# Patient Record
Sex: Female | Born: 1950 | Race: White | Hispanic: No | Marital: Married
Health system: Southern US, Community
[De-identification: ages and names within clinical notes are randomized; demographics above are authoritative.]

## PROBLEM LIST (undated history)

## (undated) DIAGNOSIS — J45909 Unspecified asthma, uncomplicated: Secondary | ICD-10-CM

## (undated) DIAGNOSIS — K760 Fatty (change of) liver, not elsewhere classified: Secondary | ICD-10-CM

## (undated) DIAGNOSIS — H3500 Unspecified background retinopathy: Secondary | ICD-10-CM

## (undated) DIAGNOSIS — K219 Gastro-esophageal reflux disease without esophagitis: Secondary | ICD-10-CM

## (undated) DIAGNOSIS — M1990 Unspecified osteoarthritis, unspecified site: Secondary | ICD-10-CM

## (undated) DIAGNOSIS — K5790 Diverticulosis of intestine, part unspecified, without perforation or abscess without bleeding: Secondary | ICD-10-CM

## (undated) DIAGNOSIS — H3530 Unspecified macular degeneration: Secondary | ICD-10-CM

## (undated) DIAGNOSIS — K449 Diaphragmatic hernia without obstruction or gangrene: Secondary | ICD-10-CM

## (undated) DIAGNOSIS — G629 Polyneuropathy, unspecified: Secondary | ICD-10-CM

## (undated) HISTORY — DX: Diaphragmatic hernia without obstruction or gangrene: K44.9

## (undated) HISTORY — DX: Unspecified asthma, uncomplicated: J45.909

## (undated) HISTORY — PX: TUBAL LIGATION: SHX77

## (undated) HISTORY — DX: Unspecified macular degeneration: H35.30

## (undated) HISTORY — DX: Diverticulosis of intestine, part unspecified, without perforation or abscess without bleeding: K57.90

## (undated) HISTORY — PX: CHOLECYSTECTOMY: SHX55

## (undated) HISTORY — DX: Unspecified osteoarthritis, unspecified site: M19.90

## (undated) HISTORY — DX: Unspecified background retinopathy: H35.00

## (undated) HISTORY — DX: Polyneuropathy, unspecified: G62.9

## (undated) HISTORY — DX: Gastro-esophageal reflux disease without esophagitis: K21.9

## (undated) HISTORY — DX: Fatty (change of) liver, not elsewhere classified: K76.0

---

## 1979-02-10 HISTORY — PX: CARDIAC CATHETERIZATION: SHX172

## 1980-06-11 HISTORY — PX: TOTAL ABDOMINAL HYSTERECTOMY W/ BILATERAL SALPINGOOPHORECTOMY: SHX83

## 2005-05-07 ENCOUNTER — Encounter: Payer: Self-pay | Admitting: Cardiology

## 2005-06-11 HISTORY — PX: REFRACTIVE SURGERY: SHX103

## 2005-11-02 ENCOUNTER — Encounter: Payer: Self-pay | Admitting: Cardiology

## 2006-02-05 ENCOUNTER — Encounter: Payer: Self-pay | Admitting: Cardiology

## 2007-04-12 ENCOUNTER — Encounter: Payer: Self-pay | Admitting: Cardiology

## 2008-01-08 ENCOUNTER — Encounter: Payer: Self-pay | Admitting: Cardiology

## 2008-07-24 ENCOUNTER — Encounter: Payer: Self-pay | Admitting: Cardiology

## 2008-08-19 ENCOUNTER — Ambulatory Visit: Payer: Self-pay | Admitting: Cardiology

## 2009-02-02 DIAGNOSIS — R079 Chest pain, unspecified: Secondary | ICD-10-CM | POA: Insufficient documentation

## 2010-10-24 NOTE — Assessment & Plan Note (Signed)
[facility]                          [facility] OFFICE NOTE   Kaylee Krause, Kaylee Krause                      MRN:          [id number]  DATE:08/19/2008                            DOB:          10/22/1950    REFERRING PHYSICIAN:  Nickie Retort, M.D.   REQUESTING PHYSICIAN:  Dr. Nickie Retort at [facility] in  [place].   REASON FOR CONSULTATION:  Chest discomfort.   HISTORY OF PRESENT ILLNESS:  Ms. 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  She has no personal history of obstructive coronary  artery disease.  Ms. Draughon states that back in the 1980s she  underwent a diagnostic cardiac catheterization at [facility] in the setting of left-sided chest pain, and reports that this  showed clean coronaries with subsequent diagnosis of gallbladder  disease resulting in a cholecystectomy.  She states that approximately 6-  8 months ago she began to experience a twisting discomfort in her  upper back radiating into her arms and ultimately culminating in hand  weakness.  This was sporadic and would sometimes bother her after long  car trips, although was not clearly exertional in nature.  She states  that these symptoms resolved in November 2009, and then she began to  feel a pressure in her chest.  This occurred initially on a once  monthly basis, although at this point is now occurring daily.  She has  NYHA class II dyspnea on exertion.   Her medical followup has been variable.  She initially saw Dr. Darius Bump and  states that she then changed to Dr. Olena Leatherwood when she was told that she  may have heart disease and needed further evaluation.  When she was told  a similar thing by Dr. Olena Leatherwood, she  stopped seeing him.  In reviewing  her records, I see that she had an electrocardiogram done in mid  February that was abnormal, showing poor anterior R-wave progression,  possibly evidence of previous anterior wall infarct, although not  definitively so.  I also was able to locate an old treadmill test from  September 2000 which revealed abnormal ST-segment changes at that time.   Ms. Bodner tells me that she is basically in denial about whether  she in fact has ischemic heart disease and is very worried about  proceeding with any further testing.  She states that her brother died  of complications with bypass surgery at age 31 and this made a big  impact on her.  She also reports concerns regarding financial  constraints.  I had a very frank discussion with her regarding her risk  factor profile for obstructive coronary artery disease, particularly in  light of her symptoms.  I discussed with her options available for  further diagnosis including proceeding on to a diagnostic cardiac  catheterization, which was actually my ultimate recommendation to her.  She states that she has  recently been placed on sublingual nitroglycerin  by Dr. Vonita Moss and has been taking an aspirin daily.  She continues to  have chest pressure on a daily basis.   I reviewed previous laboratory records from Dr. Bartholomew Crews evaluation  back in 2009 with findings of a total cholesterol of 334, triglycerides  of 348, HDL of 43 and LDL of 221.  It appears that Dr. Olena Leatherwood had her  on Zocor 40 mg a day and omega-3 supplements 1 g twice daily at that  time, although Ms. Berninger tells me that she was not able to tolerate  Zocor and subsequently Pravachol, given cramping and muscle weakness.  She is very worried about trying any further statin medications.   ALLERGIES:  To PENICILLIN.   PRESENT MEDICATIONS:  1. Aspirin 81 mg p.o. daily.  2. Glipizide 5 mg p.o. daily.  3. Hydrochlorothiazide 25 mg p.o. daily.  4.  Vitamin D 50,000 units weekly.  5. Nitroglycerin 0.4 mg sublingual p.r.n.   PAST MEDICAL HISTORY:  As outlined above.  Additional problems include:  1. Vitamin D deficiency.  2. Childhood asthma.  3. Osteoarthritis.  4. Macular degeneration.  5.  Fatty liver disease.  6. She is status post bilateral tubal ligation.  7. Total abdominal hysterectomy with bilateral salpingo-oophorectomy.  8. Cholecystectomy.  9. Previous laser surgery related to macular edema and retinopathy      back in 2007.  10.She had additional history of diverticulosis.   SOCIAL HISTORY:  The patient is married.  She has 1 son at [place]  pursuing his doctorate degree.  She is retired from working in [employer] throughout her life.  She states that she retired in  2007 with her eye disease.  She has no active tobacco or alcohol use  history.  No illicit substance use described.   FAMILY HISTORY:  Is impressive in terms of premature cardiovascular  disease.  She had a brother to die at age 61 following complications  with coronary bypass grafting.  She has a half-sister who developed  coronary artery disease in her early 36s.  She also has paternal uncles  that died in their early 29s with coronary artery disease.  Her mother  died at age 82 due to a gunshot wound and her father died with Parkinson  dementia.   REVIEW OF SYSTEMS:  As detailed above.  She additionally describes  fatigue and constipation, hiatal hernia with reflux symptoms, arthritic  pains.  No frank claudication.  No palpitations or syncope.  Otherwise  reviewed and negative.   EXAMINATION:  Weight is 176.8 pounds, heart rate 100, blood pressure is  156/99.  This is an overweight woman in no acute distress.  HEENT:  Conjunctiva is normal.  Pharynx clear.  NECK:  Supple.  No elevated jugular venous pressure, no obvious loud  carotid bruits.  No thyromegaly.  LUNGS:  Exhibit clear breath sounds.  CARDIAC EXAM:  Reveals  a regular rate and rhythm.  Soft S4 is audible.  Soft systolic murmur.  No pericardial rub or S3 gallop.  ABDOMEN:  Soft, nontender.  Bowel sounds are present.  No obvious  hepatomegaly.  EXTREMITIES:  Exhibit trace ankle edema, symmetrical, nonpitting edema.  Distal pulses are 1+.  SKIN:  Warm and dry.  MUSCULOSKELETAL:  No kyphosis noted.  NEUROPSYCHIATRIC:  The patient is alert and oriented x3.  Fairly  loquacious historian.  Somewhat anxious.   IMPRESSION AND RECOMMENDATIONS:  Ms. Strome is a 60 year old woman  with history of type 2 diabetes mellitus, elevated blood pressure today  raising the possibility of longer-standing hypertension, previously  diagnosed hyperlipidemia with a low density lipoprotein cholesterol of  221 in 2009 (and on no specific therapy, with prior intolerance to Zocor  and Pravachol), and impressive history of premature cardiovascular  disease in the family.  She has been experiencing chest pressure  following initial symptoms of a twisting upper back discomfort and  bilateral arm discomfort.  She is now having daily chest discomfort.  Recent electrocardiogram shows poor anterior R-wave progression.  She  had a previous exercise treadmill test in 2000 which showed  abnormal/diagnostic ST-segment changes, although does report a normal  cardiac catheterization at [facility] back in the  1980s prior to gallbladder surgery.  I had a very frank and realistic  discussion with Ms. Kingdon today in the office.  I explained to her  that she has a high risk for having underlying obstructive coronary  artery disease, possibly severe, based on her symptom complex and  underlying risk factors.  I explained to her that a definitive diagnosis  of the severity of her coronary artery disease would provide the  clearest way to understand strategies that might reduce her risk of  adverse cardiac events such as myocardial infarction and death.  My   ultimate recommendation was to arrange a diagnostic cardiac  catheterization as soon as possible so we could make plans for further  treatment and further clarify the problem.  She however did not want to  proceed with any testing at this time, and for that matter any addition  of medical therapy aimed at risk factor reduction.  Ms. Lazenby stated  that she wanted to talk to her family members and would like to schedule  a follow-up visit over the next few weeks.  We have therefore arranged a  follow-up visit at her request.  However, I did caution her that she  remains at fairly high risk of adverse cardiac events, and that if she  develops sudden worsening in symptoms, she should seek urgent medical  attention and likely hospital evaluation.  She states that she will  continue to take her aspirin and has sublingual nitroglycerin at home.     Jonelle Sidle, MD  Electronically Signed    SGM/MedQ  DD: 08/19/2008  DT: 08/19/2008  Job #: [id number]   cc:   Nickie Retort, M.D.

## 2011-01-25 ENCOUNTER — Telehealth: Payer: Self-pay | Admitting: *Deleted

## 2011-01-25 NOTE — Telephone Encounter (Signed)
Spoke with patient and she wanted to know the pricing for heart cath since she was informed by MD in 2010 that she needed to have one. Patient informed nurse that she will be needing surgery on her eyes and the surgeon told her that she would need heart cath before they can do the surgery. Nurse explained to patient that she would need an appointment first to determine if cath still needed and that she could find out form her insurance company if they would cover a heart cath. Nurse also gave patient the number to billing department so she can get a ball park figure of pricing for a heart cath. Nurse advised patient that if she decided that she wanted an appointment, she could call us back and anyone could schedule this for her. Patient verbalized understanding of plan.

## 2011-03-01 ENCOUNTER — Encounter: Payer: Self-pay | Admitting: *Deleted

## 2011-03-02 ENCOUNTER — Encounter: Payer: Self-pay | Admitting: Cardiology

## 2011-03-08 ENCOUNTER — Telehealth: Payer: Self-pay | Admitting: Cardiology

## 2011-04-05 NOTE — Telephone Encounter (Signed)
Noted  

## 2012-12-09 ENCOUNTER — Other Ambulatory Visit: Payer: Self-pay | Admitting: Physician Assistant

## 2012-12-09 DIAGNOSIS — R921 Mammographic calcification found on diagnostic imaging of breast: Secondary | ICD-10-CM

## 2012-12-15 ENCOUNTER — Ambulatory Visit
Admission: RE | Admit: 2012-12-15 | Discharge: 2012-12-15 | Disposition: A | Discharge status: Home / Self Care | Payer: Medicare PPO | Source: Ambulatory Visit | Attending: Physician Assistant | Admitting: Physician Assistant

## 2012-12-15 DIAGNOSIS — R921 Mammographic calcification found on diagnostic imaging of breast: Secondary | ICD-10-CM

## 2013-04-24 ENCOUNTER — Other Ambulatory Visit: Payer: Self-pay | Admitting: Physician Assistant

## 2013-04-24 DIAGNOSIS — R921 Mammographic calcification found on diagnostic imaging of breast: Secondary | ICD-10-CM

## 2013-05-19 ENCOUNTER — Ambulatory Visit
Admission: RE | Admit: 2013-05-19 | Discharge: 2013-05-19 | Disposition: A | Discharge status: Home / Self Care | Payer: Medicare PPO | Source: Ambulatory Visit | Attending: Physician Assistant | Admitting: Physician Assistant

## 2013-05-19 DIAGNOSIS — R921 Mammographic calcification found on diagnostic imaging of breast: Secondary | ICD-10-CM

## 2013-09-08 ENCOUNTER — Other Ambulatory Visit: Payer: Self-pay | Admitting: Physician Assistant

## 2013-09-08 DIAGNOSIS — R921 Mammographic calcification found on diagnostic imaging of breast: Secondary | ICD-10-CM

## 2013-12-07 ENCOUNTER — Ambulatory Visit
Admission: RE | Admit: 2013-12-07 | Discharge: 2013-12-07 | Disposition: A | Discharge status: Home / Self Care | Payer: Medicare PPO | Source: Ambulatory Visit | Attending: Physician Assistant | Admitting: Physician Assistant

## 2013-12-07 DIAGNOSIS — R921 Mammographic calcification found on diagnostic imaging of breast: Secondary | ICD-10-CM

## 2014-09-15 ENCOUNTER — Other Ambulatory Visit: Payer: Self-pay

## 2014-09-15 DIAGNOSIS — Z1231 Encounter for screening mammogram for malignant neoplasm of breast: Secondary | ICD-10-CM

## 2014-12-09 ENCOUNTER — Ambulatory Visit
Admission: RE | Admit: 2014-12-09 | Discharge: 2014-12-09 | Disposition: A | Discharge status: Home / Self Care | Payer: Medicare Other | Source: Ambulatory Visit

## 2014-12-09 DIAGNOSIS — Z1231 Encounter for screening mammogram for malignant neoplasm of breast: Secondary | ICD-10-CM

## 2018-03-04 ENCOUNTER — Other Ambulatory Visit: Payer: Self-pay | Admitting: Family Medicine

## 2018-03-04 DIAGNOSIS — Z1231 Encounter for screening mammogram for malignant neoplasm of breast: Secondary | ICD-10-CM

## 2019-01-05 ENCOUNTER — Other Ambulatory Visit: Payer: Self-pay | Admitting: Family Medicine

## 2019-01-05 DIAGNOSIS — N644 Mastodynia: Secondary | ICD-10-CM

## 2019-01-05 DIAGNOSIS — Z1231 Encounter for screening mammogram for malignant neoplasm of breast: Secondary | ICD-10-CM

## 2019-01-09 ENCOUNTER — Ambulatory Visit
Admission: RE | Admit: 2019-01-09 | Discharge: 2019-01-09 | Disposition: A | Discharge status: Home / Self Care | Payer: Medicare Other | Source: Ambulatory Visit | Attending: Family Medicine | Admitting: Family Medicine

## 2019-01-09 ENCOUNTER — Other Ambulatory Visit: Payer: Self-pay

## 2019-01-09 ENCOUNTER — Other Ambulatory Visit: Payer: Self-pay | Admitting: Family Medicine

## 2019-01-09 DIAGNOSIS — N6310 Unspecified lump in the right breast, unspecified quadrant: Secondary | ICD-10-CM

## 2019-01-09 DIAGNOSIS — N644 Mastodynia: Secondary | ICD-10-CM

## 2019-04-10 ENCOUNTER — Ambulatory Visit
Admission: RE | Admit: 2019-04-10 | Discharge: 2019-04-10 | Disposition: A | Discharge status: Home / Self Care | Payer: Medicare Other | Source: Ambulatory Visit | Attending: Family Medicine | Admitting: Family Medicine

## 2019-04-10 ENCOUNTER — Other Ambulatory Visit: Payer: Self-pay

## 2019-04-10 DIAGNOSIS — N6310 Unspecified lump in the right breast, unspecified quadrant: Secondary | ICD-10-CM

## 2019-06-15 ENCOUNTER — Other Ambulatory Visit: Payer: Self-pay | Admitting: Family Medicine

## 2019-06-15 DIAGNOSIS — Z1231 Encounter for screening mammogram for malignant neoplasm of breast: Secondary | ICD-10-CM

## 2019-08-11 ENCOUNTER — Other Ambulatory Visit: Payer: Medicare Other

## 2020-01-15 ENCOUNTER — Ambulatory Visit: Payer: Medicare Other

## 2020-03-16 IMAGING — MG DIGITAL DIAGNOSTIC BILATERAL MAMMOGRAM WITH TOMO AND CAD
8 of 14 series · 8 of 40 positions shown · non-contrast
Comparison: Previous exam(s).

CLINICAL DATA: 68-year-old female with burning sensation of the
right axilla and upper outer quadrant for several months as well as
a medial right breast palpable lump. The patient does not report any
known injury. However, she is legally blind and states she could
have inadvertently injured her breast without realizing.

EXAM:
DIGITAL DIAGNOSTIC BILATERAL MAMMOGRAM WITH CAD AND TOMO
ULTRASOUND RIGHT BREAST

[R MLO synth-2D (1 of 2)]
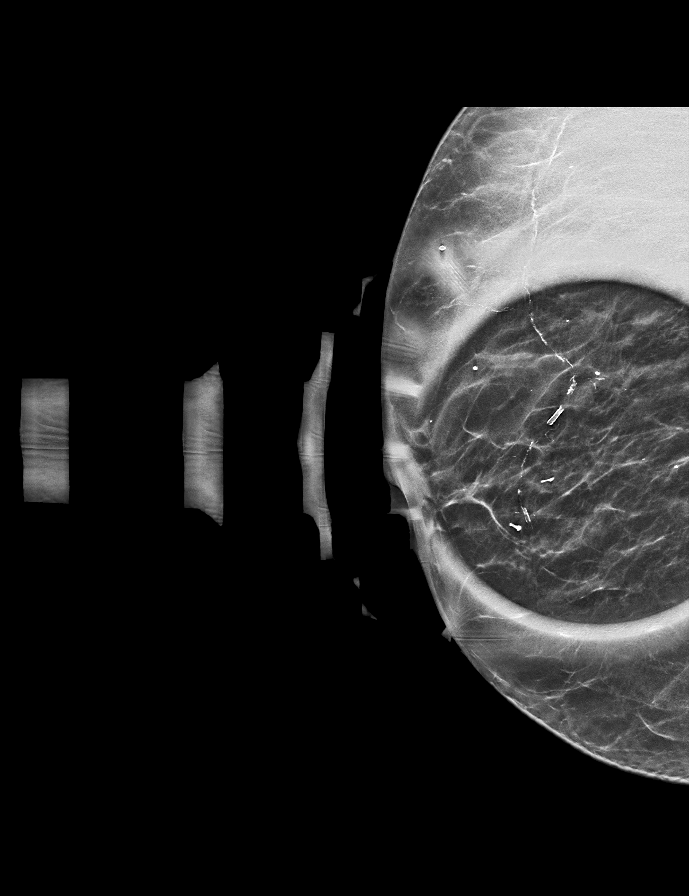

[R CC synth-2D (1 of 2)]
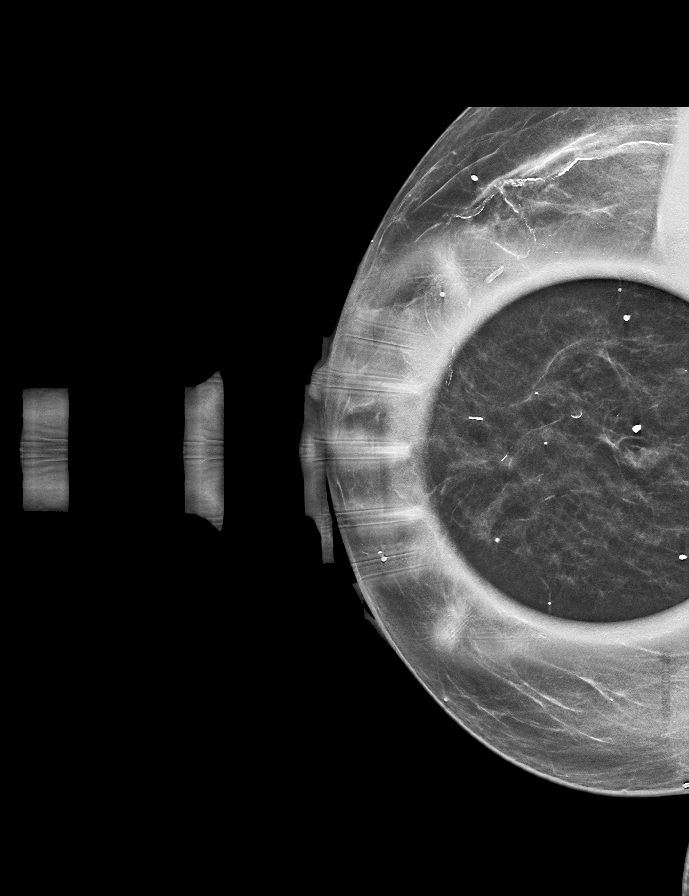

[L MLO synth-2D]
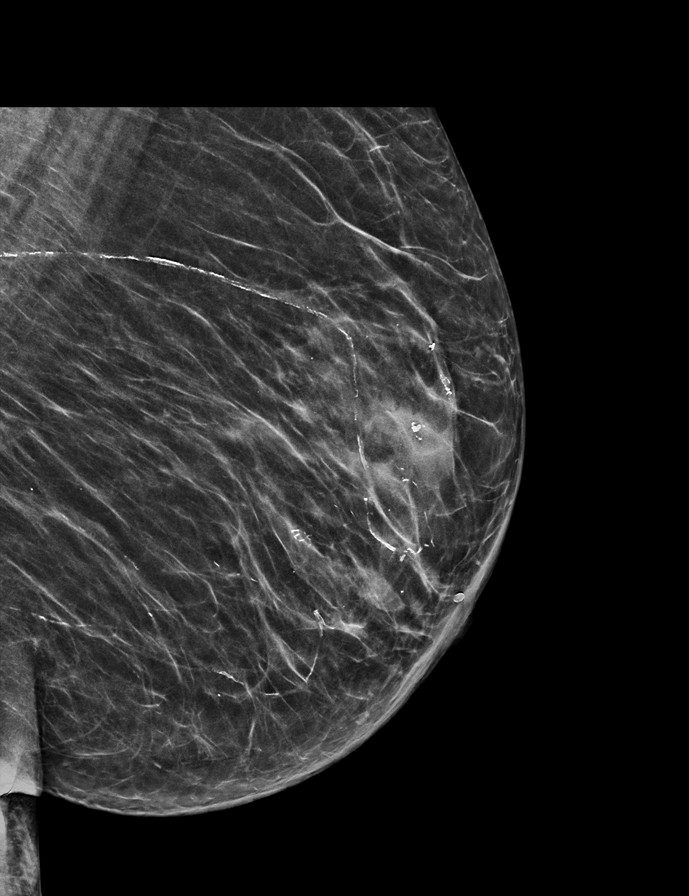

[R TAN synth-2D]
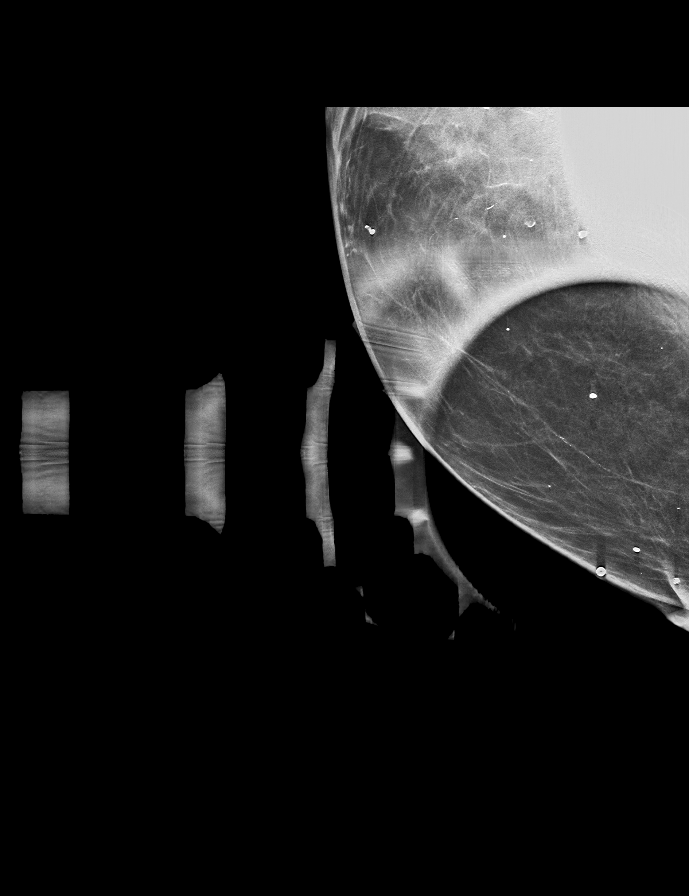

[L CC synth-2D]
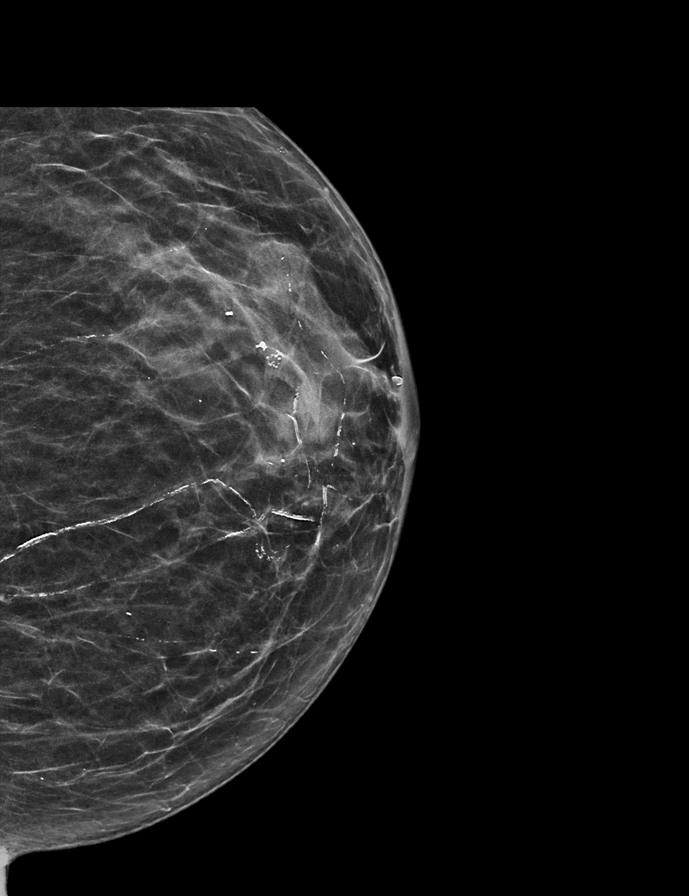

[R CC synth-2D (2 of 2)]
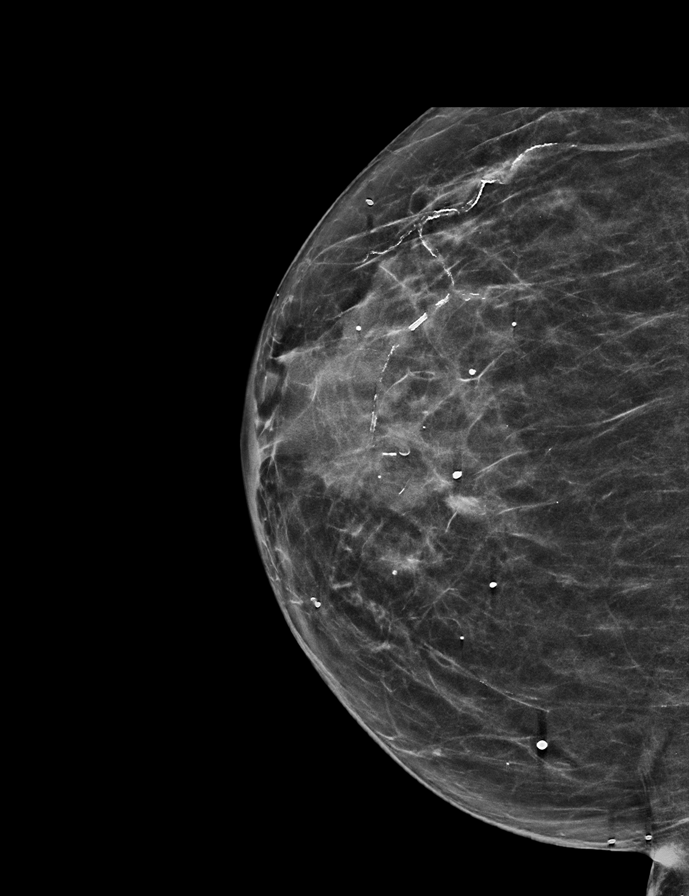

[R MLO synth-2D (2 of 2)]
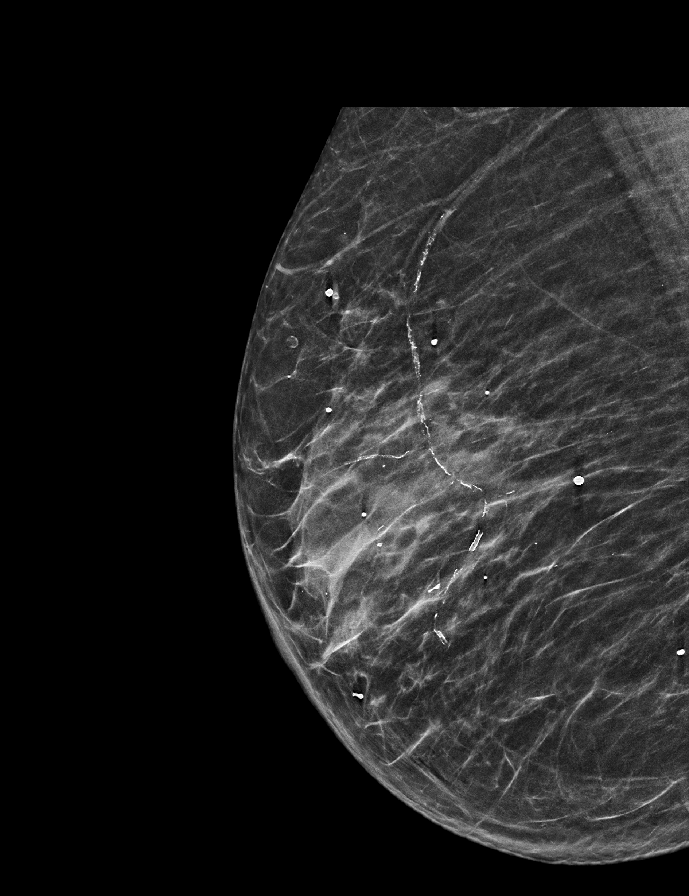

[L CC tomo · tomo slice 27/52.0]
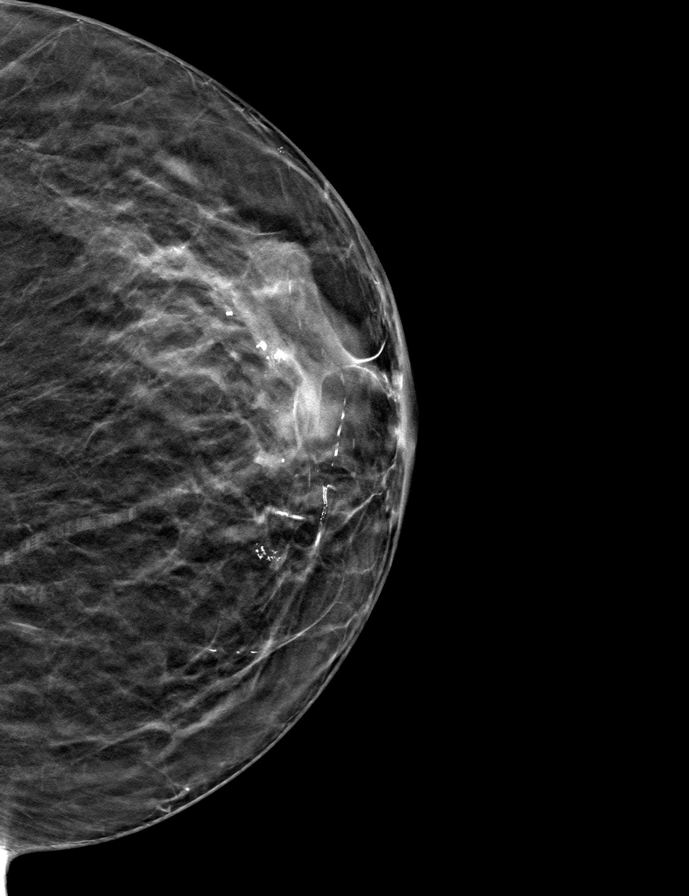

[8 of 40 positions shown; findings below may reference images not displayed]

ACR Breast Density Category c: The breast tissue is heterogeneously
dense, which may obscure small masses.
FINDINGS: An asymmetry is identified in the central right breast at middle
depth on the CC projection only. A radiopaque BB at the medial
aspect demonstrates no associated findings at the site of the
patient's palpable lump. No new or additional suspicious
mammographic findings are identified in either breast.

Mammographic images were processed with CAD.

Targeted ultrasound is performed, showing an ill-defined hyperechoic
mass with internal cystic foci at the 12 o'clock position 4-5 cm
from the nipple on the right. It measures approximately 13 x 11 x 7
mm. There is no internal vascularity. This may correspond with the
mammographic finding. Evaluation at the site of the patient's right
breast burning and palpable lump demonstrate no associated
sonographic findings.
IMPRESSION: 1. Probably benign, probable fat necrosis involving the superior
right breast which may correspond with the mammographically
identified asymmetry. Recommendation is for short-term interval
follow-up.
2. No mammographic evidence of malignancy on the left.

RECOMMENDATION:
1. Clinical follow-up recommended for the symptomatic area of
concern in the right breast. Any further workup should be based on
clinical grounds.
2. Diagnostic right breast mammogram and ultrasound in 3 months.

I have discussed the findings and recommendations with the patient.
Results were also provided in writing at the conclusion of the
visit. If applicable, a reminder letter will be sent to the patient
regarding the next appointment.

BI-RADS CATEGORY  3: Probably benign.

## 2020-06-15 IMAGING — US US BREAST*R* LIMITED INC AXILLA
1 series · 7 of 7 positions shown · non-contrast
Comparison: Previous exam(s).

CLINICAL DATA: 68-year-old female for 3 month follow-up of probable
RIGHT breast fat necrosis.

EXAM:
DIGITAL DIAGNOSTIC RIGHT MAMMOGRAM WITH CAD AND TOMO
ULTRASOUND RIGHT BREAST

[Series 1: us breast*right* limited inc axilla · 0.05mm/px · 7 of 7 slices shown]
[im 1/7]
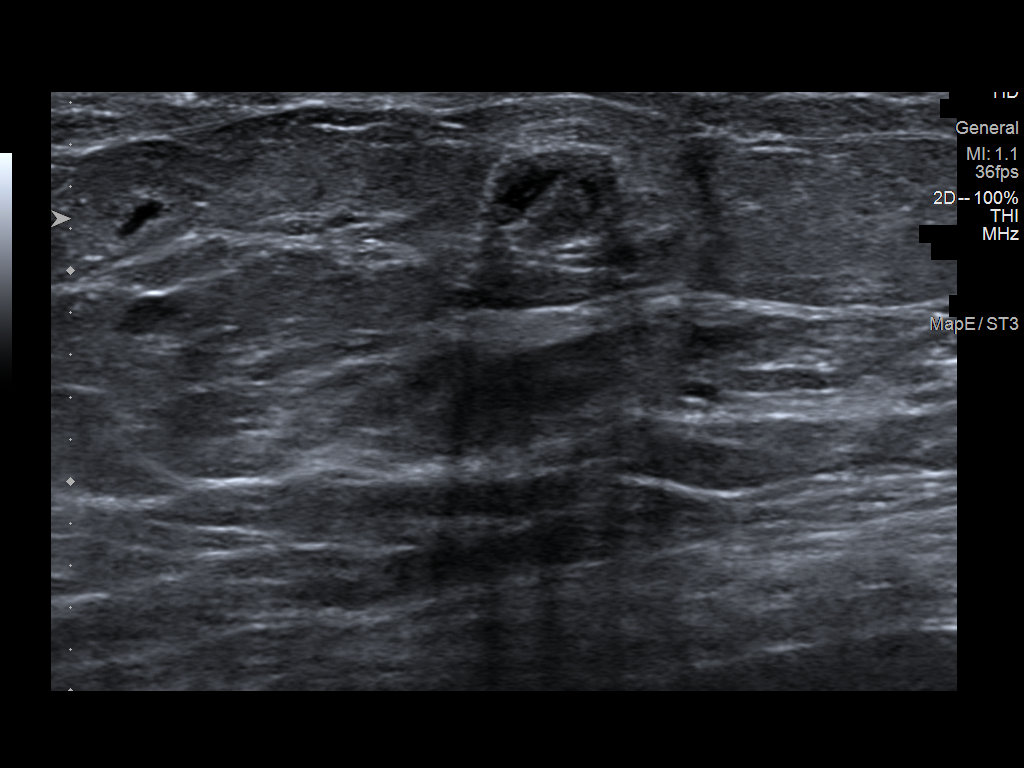
[im 2/7]
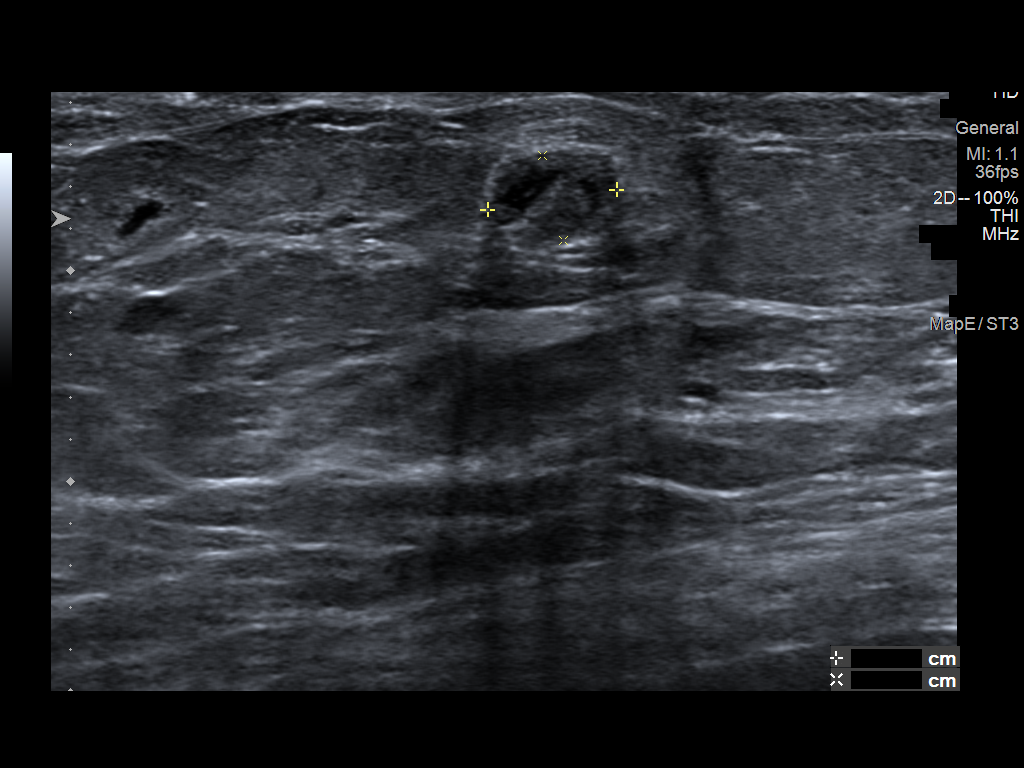
[im 3/7]
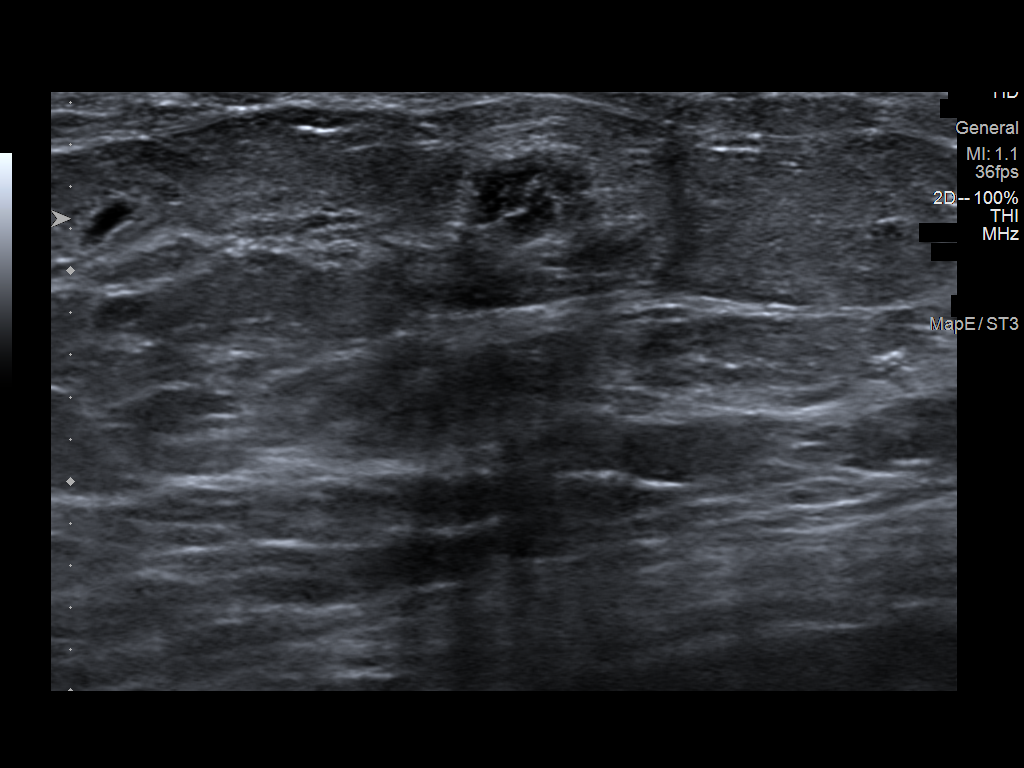
[im 4/7]
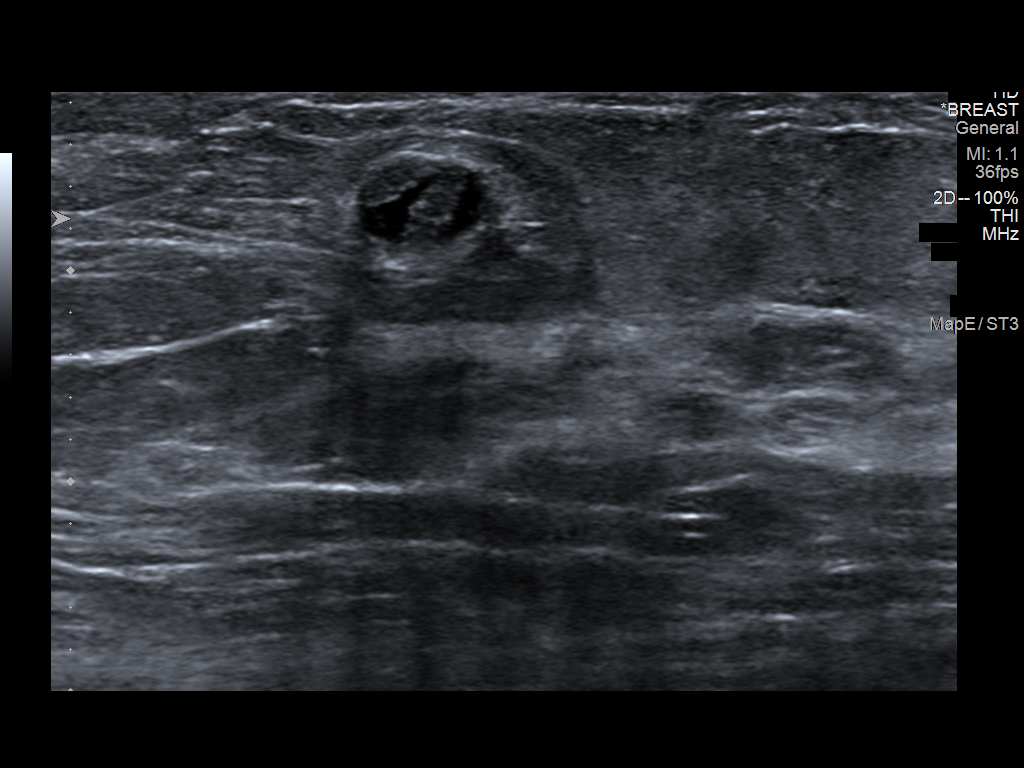
[im 5/7]
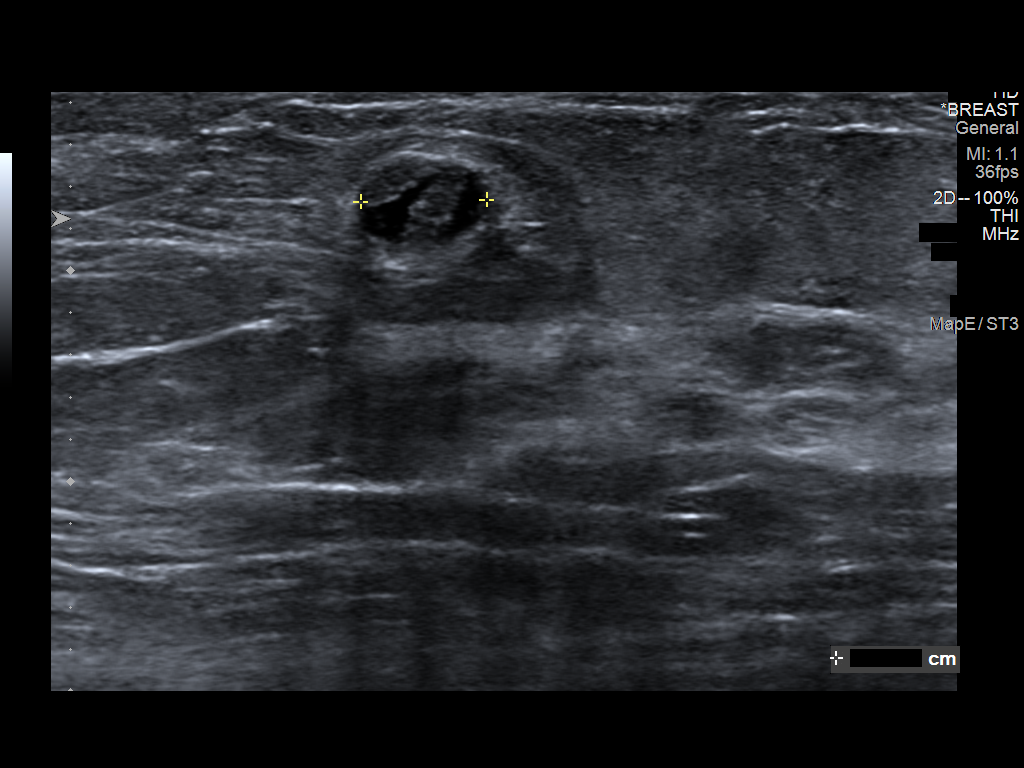
[im 6/7]
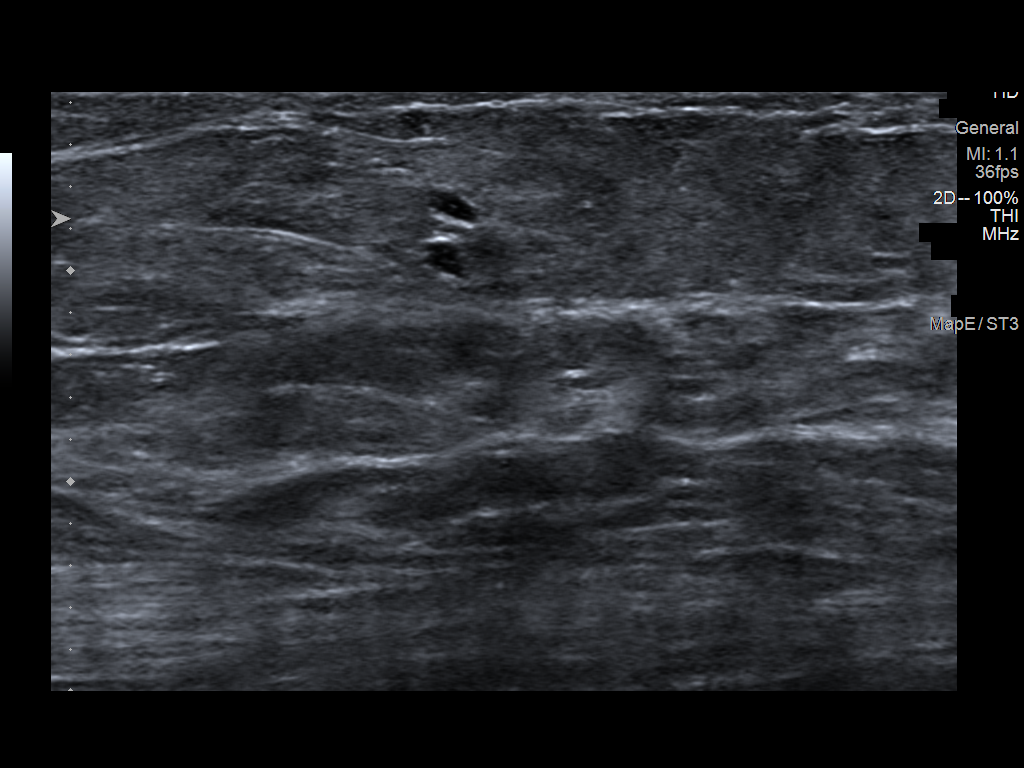
[im 7/7]
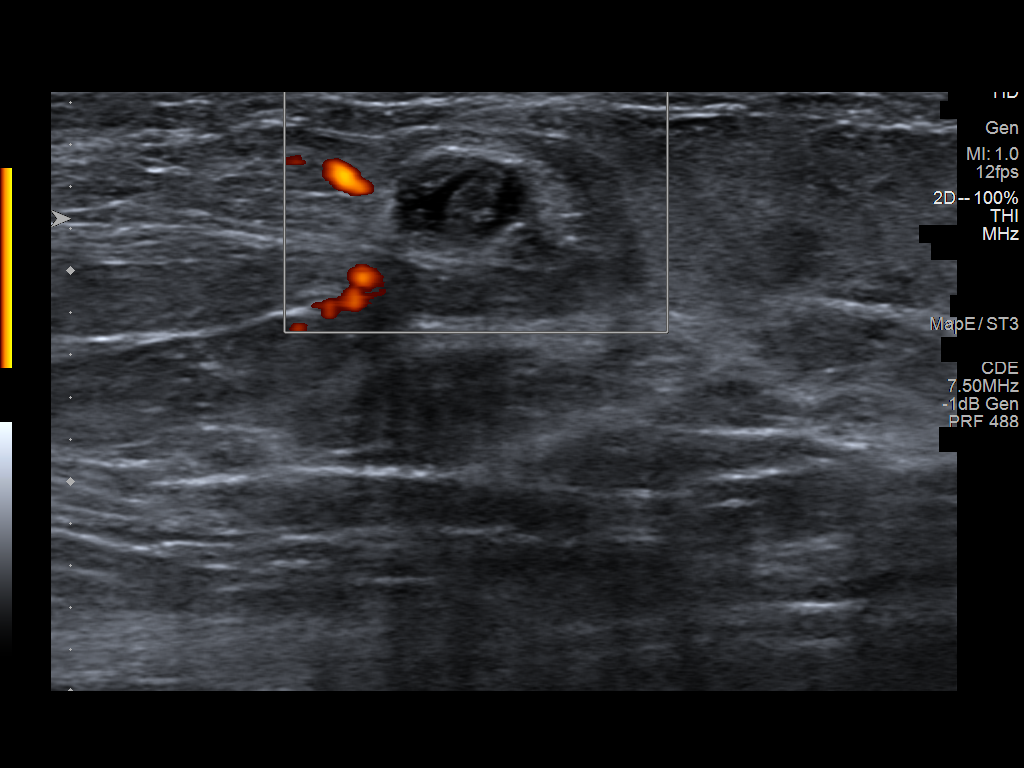

[7 of 7 positions shown; findings below may reference images not displayed]

ACR Breast Density Category c: The breast tissue is heterogeneously
dense, which may obscure small masses.
FINDINGS: 2D/3D full field views of the RIGHT breast demonstrate new definite
fat necrosis changes within the UPPER RIGHT breast.

No suspicious RIGHT breast findings are noted.

Mammographic images were processed with CAD.

Targeted ultrasound is performed, showing a 0.6 x 0.4 x 0.6 cm
complicated cyst at the 12 o'clock position of the RIGHT breast 5 cm
from the nipple, decreased in size previously measuring 1.1 x 0.7 x
1.3 cm.
IMPRESSION: Improved benign fat necrosis changes within the UPPER RIGHT breast.
No further imaging follow-up recommended.

RECOMMENDATION:
Bilateral screening mammogram in 8 months to resume annual mammogram
schedule.

I have discussed the findings and recommendations with the patient.
If applicable, a reminder letter will be sent to the patient
regarding the next appointment.

BI-RADS CATEGORY  2: Benign.
